# Patient Record
Sex: Male | Born: 2000
Health system: Southern US, Community
[De-identification: ages and names within clinical notes are randomized; demographics above are authoritative.]

## PROBLEM LIST (undated history)

## (undated) DIAGNOSIS — R109 Unspecified abdominal pain: Secondary | ICD-10-CM

## (undated) HISTORY — DX: Unspecified abdominal pain: R10.9

---
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
## 2020-09-15 DIAGNOSIS — J0110 Acute frontal sinusitis, unspecified: Secondary | ICD-10-CM | POA: Diagnosis not present

## 2020-09-15 DIAGNOSIS — R070 Pain in throat: Secondary | ICD-10-CM | POA: Diagnosis not present

## 2020-09-15 DIAGNOSIS — Z20822 Contact with and (suspected) exposure to covid-19: Secondary | ICD-10-CM | POA: Diagnosis not present

## 2020-09-22 DIAGNOSIS — J209 Acute bronchitis, unspecified: Secondary | ICD-10-CM | POA: Diagnosis not present

## 2020-09-22 DIAGNOSIS — J019 Acute sinusitis, unspecified: Secondary | ICD-10-CM | POA: Diagnosis not present

## 2020-09-22 DIAGNOSIS — Z03818 Encounter for observation for suspected exposure to other biological agents ruled out: Secondary | ICD-10-CM | POA: Diagnosis not present

## 2020-09-22 DIAGNOSIS — J029 Acute pharyngitis, unspecified: Secondary | ICD-10-CM | POA: Diagnosis not present

## 2020-10-21 DIAGNOSIS — Z1152 Encounter for screening for COVID-19: Secondary | ICD-10-CM | POA: Diagnosis not present

## 2020-10-21 DIAGNOSIS — Z03818 Encounter for observation for suspected exposure to other biological agents ruled out: Secondary | ICD-10-CM | POA: Diagnosis not present

## 2021-02-02 DIAGNOSIS — L239 Allergic contact dermatitis, unspecified cause: Secondary | ICD-10-CM | POA: Diagnosis not present

## 2021-02-08 DIAGNOSIS — Z03818 Encounter for observation for suspected exposure to other biological agents ruled out: Secondary | ICD-10-CM | POA: Diagnosis not present

## 2021-02-08 DIAGNOSIS — Z20822 Contact with and (suspected) exposure to covid-19: Secondary | ICD-10-CM | POA: Diagnosis not present

## 2021-03-16 DIAGNOSIS — R300 Dysuria: Secondary | ICD-10-CM | POA: Diagnosis not present

## 2021-04-25 DIAGNOSIS — Z03818 Encounter for observation for suspected exposure to other biological agents ruled out: Secondary | ICD-10-CM | POA: Diagnosis not present

## 2021-04-25 DIAGNOSIS — Z20822 Contact with and (suspected) exposure to covid-19: Secondary | ICD-10-CM | POA: Diagnosis not present

## 2021-05-04 DIAGNOSIS — J40 Bronchitis, not specified as acute or chronic: Secondary | ICD-10-CM | POA: Diagnosis not present

## 2021-05-04 DIAGNOSIS — R059 Cough, unspecified: Secondary | ICD-10-CM | POA: Diagnosis not present

## 2021-06-07 DIAGNOSIS — R062 Wheezing: Secondary | ICD-10-CM | POA: Diagnosis not present

## 2021-06-07 DIAGNOSIS — J4520 Mild intermittent asthma, uncomplicated: Secondary | ICD-10-CM | POA: Diagnosis not present

## 2021-06-07 DIAGNOSIS — J45909 Unspecified asthma, uncomplicated: Secondary | ICD-10-CM | POA: Diagnosis not present

## 2021-08-29 ENCOUNTER — Encounter: Payer: Self-pay | Admitting: *Deleted

## 2021-08-29 ENCOUNTER — Other Ambulatory Visit: Payer: Self-pay

## 2021-08-29 DIAGNOSIS — Z5321 Procedure and treatment not carried out due to patient leaving prior to being seen by health care provider: Secondary | ICD-10-CM | POA: Insufficient documentation

## 2021-08-29 DIAGNOSIS — R109 Unspecified abdominal pain: Secondary | ICD-10-CM | POA: Insufficient documentation

## 2021-08-29 DIAGNOSIS — Z20822 Contact with and (suspected) exposure to covid-19: Secondary | ICD-10-CM | POA: Diagnosis not present

## 2021-08-29 DIAGNOSIS — R110 Nausea: Secondary | ICD-10-CM | POA: Insufficient documentation

## 2021-08-29 DIAGNOSIS — R1084 Generalized abdominal pain: Secondary | ICD-10-CM | POA: Diagnosis present

## 2021-08-29 DIAGNOSIS — K353 Acute appendicitis with localized peritonitis, without perforation or gangrene: Secondary | ICD-10-CM | POA: Diagnosis not present

## 2021-08-29 NOTE — ED Triage Notes (Addendum)
Pt to ED reporting abd pain throughout the day. Pain is worse with moving and walking. Nausea without vomiting or diarrhea.    Pain is worse after eating.

## 2021-08-30 ENCOUNTER — Encounter
Admission: EM | Disposition: A | Discharge status: Home / Self Care | Payer: Self-pay | Source: Home / Self Care | Attending: Emergency Medicine

## 2021-08-30 ENCOUNTER — Other Ambulatory Visit: Payer: Self-pay

## 2021-08-30 ENCOUNTER — Observation Stay: Payer: 59 | Admitting: Registered Nurse

## 2021-08-30 ENCOUNTER — Observation Stay
Admission: EM | Admit: 2021-08-30 | Discharge: 2021-08-31 | Disposition: A | Discharge status: Home / Self Care | Payer: 59 | Attending: Surgery | Admitting: Surgery

## 2021-08-30 ENCOUNTER — Emergency Department
Admission: EM | Admit: 2021-08-30 | Discharge: 2021-08-30 | Disposition: A | Discharge status: Home / Self Care | Payer: 59 | Source: Home / Self Care

## 2021-08-30 ENCOUNTER — Emergency Department: Payer: 59

## 2021-08-30 DIAGNOSIS — K3580 Unspecified acute appendicitis: Secondary | ICD-10-CM | POA: Diagnosis present

## 2021-08-30 DIAGNOSIS — K353 Acute appendicitis with localized peritonitis, without perforation or gangrene: Secondary | ICD-10-CM | POA: Diagnosis not present

## 2021-08-30 DIAGNOSIS — Z20822 Contact with and (suspected) exposure to covid-19: Secondary | ICD-10-CM | POA: Insufficient documentation

## 2021-08-30 DIAGNOSIS — K3532 Acute appendicitis with perforation and localized peritonitis, without abscess: Secondary | ICD-10-CM

## 2021-08-30 DIAGNOSIS — R109 Unspecified abdominal pain: Secondary | ICD-10-CM | POA: Diagnosis not present

## 2021-08-30 DIAGNOSIS — K37 Unspecified appendicitis: Secondary | ICD-10-CM | POA: Diagnosis present

## 2021-08-30 HISTORY — PX: LAPAROSCOPIC APPENDECTOMY: SHX408

## 2021-08-30 LAB — COMPREHENSIVE METABOLIC PANEL
ALT: 21 U/L (ref 0–44)
AST: 22 U/L (ref 15–41)
Albumin: 4.4 g/dL (ref 3.5–5.0)
Alkaline Phosphatase: 73 U/L (ref 38–126)
Anion gap: 9 (ref 5–15)
BUN: 14 mg/dL (ref 6–20)
CO2: 27 mmol/L (ref 22–32)
Calcium: 9.4 mg/dL (ref 8.9–10.3)
Chloride: 102 mmol/L (ref 98–111)
Creatinine, Ser: 1.44 mg/dL — ABNORMAL HIGH (ref 0.61–1.24)
GFR, Estimated: >60 mL/min (ref >60)
Glucose, Bld: 108 mg/dL — ABNORMAL HIGH (ref 70–99)
Potassium: 3.9 mmol/L (ref 3.5–5.1)
Sodium: 138 mmol/L (ref 135–145)
Total Bilirubin: 0.8 mg/dL (ref 0.3–1.2)
Total Protein: 8.4 g/dL — ABNORMAL HIGH (ref 6.5–8.1)

## 2021-08-30 LAB — CBC
HCT: 45 % (ref 39.0–52.0)
Hemoglobin: 16.1 g/dL (ref 13.0–17.0)
MCH: 29.2 pg (ref 26.0–34.0)
MCHC: 35.8 g/dL (ref 30.0–36.0)
MCV: 81.7 fL (ref 80.0–100.0)
Platelets: 313 10*3/uL (ref 150–400)
RBC: 5.51 MIL/uL (ref 4.22–5.81)
RDW: 11.6 % (ref 11.5–15.5)
WBC: 15.8 10*3/uL — ABNORMAL HIGH (ref 4.0–10.5)
nRBC: 0 % (ref 0.0–0.2)

## 2021-08-30 LAB — URINALYSIS, ROUTINE W REFLEX MICROSCOPIC
Bacteria, UA: NONE SEEN
Bilirubin Urine: NEGATIVE
Glucose, UA: NEGATIVE mg/dL
Hgb urine dipstick: NEGATIVE
Ketones, ur: NEGATIVE mg/dL
Leukocytes,Ua: NEGATIVE
Nitrite: NEGATIVE
Protein, ur: NEGATIVE mg/dL
Specific Gravity, Urine: 1.018 (ref 1.005–1.030)
Squamous Epithelial / HPF: NONE SEEN (ref 0–5)
pH: 5 (ref 5.0–8.0)

## 2021-08-30 LAB — RESP PANEL BY RT-PCR (FLU A&B, COVID) ARPGX2
Influenza A by PCR: NEGATIVE
Influenza B by PCR: NEGATIVE
SARS Coronavirus 2 by RT PCR: NEGATIVE

## 2021-08-30 LAB — LIPASE, BLOOD: Lipase: 31 U/L (ref 11–51)

## 2021-08-30 SURGERY — APPENDECTOMY, LAPAROSCOPIC
Anesthesia: General | Site: Abdomen

## 2021-08-30 MED ORDER — PIPERACILLIN-TAZOBACTAM 3.375 G IVPB
3.3750 g | Freq: Three times a day (TID) | INTRAVENOUS | Status: DC
  Administered 2021-08-30 – 2021-08-31 (×3): 3.375 g via INTRAVENOUS
  Filled 2021-08-30 (×3): qty 50

## 2021-08-30 MED ORDER — ACETAMINOPHEN 500 MG PO TABS: 1000.0000 mg | ORAL_TABLET | Freq: Four times a day (QID) | ORAL | Status: DC

## 2021-08-30 MED ORDER — ROCURONIUM BROMIDE 100 MG/10ML IV SOLN
INTRAVENOUS | Status: DC | PRN
  Administered 2021-08-30: 10 mg via INTRAVENOUS
  Administered 2021-08-30: 40 mg via INTRAVENOUS

## 2021-08-30 MED ORDER — KETOROLAC TROMETHAMINE 15 MG/ML IJ SOLN
15.0000 mg | Freq: Four times a day (QID) | INTRAMUSCULAR | Status: DC
  Administered 2021-08-30 – 2021-08-31 (×3): 15 mg via INTRAVENOUS
  Filled 2021-08-30 (×3): qty 1

## 2021-08-30 MED ORDER — 0.9 % SODIUM CHLORIDE (POUR BTL) OPTIME
TOPICAL | Status: DC | PRN
  Administered 2021-08-30: 200 mL

## 2021-08-30 MED ORDER — SODIUM CHLORIDE 0.9 % IV SOLN: INTRAVENOUS | Status: DC

## 2021-08-30 MED ORDER — MIDAZOLAM HCL 2 MG/2ML IJ SOLN
INTRAMUSCULAR | Status: DC | PRN
  Administered 2021-08-30: 2 mg via INTRAVENOUS

## 2021-08-30 MED ORDER — ONDANSETRON HCL 4 MG/2ML IJ SOLN
INTRAMUSCULAR | Status: DC | PRN
  Administered 2021-08-30: 4 mg via INTRAVENOUS

## 2021-08-30 MED ORDER — DEXMEDETOMIDINE (PRECEDEX) IN NS 20 MCG/5ML (4 MCG/ML) IV SYRINGE
PREFILLED_SYRINGE | INTRAVENOUS | Status: DC | PRN
  Administered 2021-08-30 (×2): 4 ug via INTRAVENOUS

## 2021-08-30 MED ORDER — KETOROLAC TROMETHAMINE 30 MG/ML IJ SOLN
INTRAMUSCULAR | Status: DC | PRN
  Administered 2021-08-30: 15 mg via INTRAVENOUS

## 2021-08-30 MED ORDER — FENTANYL CITRATE (PF) 100 MCG/2ML IJ SOLN
INTRAMUSCULAR | Status: AC
  Filled 2021-08-30: qty 2

## 2021-08-30 MED ORDER — OXYCODONE HCL 5 MG PO TABS
5.0000 mg | ORAL_TABLET | ORAL | Status: DC | PRN
  Administered 2021-08-30: 5 mg via ORAL

## 2021-08-30 MED ORDER — ONDANSETRON HCL 4 MG/2ML IJ SOLN
4.0000 mg | Freq: Once | INTRAMUSCULAR | Status: AC
  Administered 2021-08-30: 4 mg via INTRAVENOUS
  Filled 2021-08-30: qty 2

## 2021-08-30 MED ORDER — DEXAMETHASONE SODIUM PHOSPHATE 10 MG/ML IJ SOLN
INTRAMUSCULAR | Status: DC | PRN
  Administered 2021-08-30: 10 mg via INTRAVENOUS

## 2021-08-30 MED ORDER — MORPHINE SULFATE (PF) 4 MG/ML IV SOLN
6.0000 mg | Freq: Once | INTRAVENOUS | Status: AC
  Administered 2021-08-30: 6 mg via INTRAVENOUS
  Filled 2021-08-30: qty 2

## 2021-08-30 MED ORDER — ONDANSETRON HCL 4 MG/2ML IJ SOLN: 4.0000 mg | Freq: Four times a day (QID) | INTRAMUSCULAR | Status: DC | PRN

## 2021-08-30 MED ORDER — BUPIVACAINE LIPOSOME 1.3 % IJ SUSP
INTRAMUSCULAR | Status: AC
  Filled 2021-08-30: qty 20

## 2021-08-30 MED ORDER — ACETAMINOPHEN 500 MG PO TABS
1000.0000 mg | ORAL_TABLET | Freq: Four times a day (QID) | ORAL | Status: DC
  Administered 2021-08-30 – 2021-08-31 (×4): 1000 mg via ORAL
  Filled 2021-08-30 (×4): qty 2

## 2021-08-30 MED ORDER — ONDANSETRON HCL 4 MG/2ML IJ SOLN
INTRAMUSCULAR | Status: AC
  Filled 2021-08-30: qty 2

## 2021-08-30 MED ORDER — ONDANSETRON 4 MG PO TBDP: 4.0000 mg | ORAL_TABLET | Freq: Four times a day (QID) | ORAL | Status: DC | PRN

## 2021-08-30 MED ORDER — ROCURONIUM BROMIDE 10 MG/ML (PF) SYRINGE
PREFILLED_SYRINGE | INTRAVENOUS | Status: AC
  Filled 2021-08-30: qty 10

## 2021-08-30 MED ORDER — PHENYLEPHRINE HCL-NACL 20-0.9 MG/250ML-% IV SOLN
INTRAVENOUS | Status: AC
  Filled 2021-08-30: qty 250

## 2021-08-30 MED ORDER — LACTATED RINGERS IV BOLUS
1000.0000 mL | Freq: Once | INTRAVENOUS | Status: AC
  Administered 2021-08-30: 1000 mL via INTRAVENOUS

## 2021-08-30 MED ORDER — LIDOCAINE HCL (CARDIAC) PF 100 MG/5ML IV SOSY
PREFILLED_SYRINGE | INTRAVENOUS | Status: DC | PRN
  Administered 2021-08-30: 80 mg via INTRAVENOUS

## 2021-08-30 MED ORDER — FENTANYL CITRATE (PF) 100 MCG/2ML IJ SOLN
INTRAMUSCULAR | Status: DC | PRN
  Administered 2021-08-30: 100 ug via INTRAVENOUS

## 2021-08-30 MED ORDER — SUGAMMADEX SODIUM 200 MG/2ML IV SOLN
INTRAVENOUS | Status: DC | PRN
  Administered 2021-08-30: 200 mg via INTRAVENOUS

## 2021-08-30 MED ORDER — PROPOFOL 10 MG/ML IV BOLUS
INTRAVENOUS | Status: AC
  Filled 2021-08-30: qty 20

## 2021-08-30 MED ORDER — PIPERACILLIN-TAZOBACTAM 3.375 G IVPB 30 MIN: 3.3750 g | Freq: Once | INTRAVENOUS | Status: DC

## 2021-08-30 MED ORDER — IOHEXOL 300 MG/ML  SOLN
100.0000 mL | Freq: Once | INTRAMUSCULAR | Status: AC | PRN
  Administered 2021-08-30: 100 mL via INTRAVENOUS
  Filled 2021-08-30: qty 100

## 2021-08-30 MED ORDER — DEXAMETHASONE SODIUM PHOSPHATE 10 MG/ML IJ SOLN
INTRAMUSCULAR | Status: AC
  Filled 2021-08-30: qty 1

## 2021-08-30 MED ORDER — BUPIVACAINE-EPINEPHRINE 0.25% -1:200000 IJ SOLN
INTRAMUSCULAR | Status: DC | PRN
  Administered 2021-08-30: 50 mL via SURGICAL_CAVITY

## 2021-08-30 MED ORDER — BUPIVACAINE-EPINEPHRINE (PF) 0.25% -1:200000 IJ SOLN
INTRAMUSCULAR | Status: AC
  Filled 2021-08-30: qty 30

## 2021-08-30 MED ORDER — FENTANYL CITRATE (PF) 100 MCG/2ML IJ SOLN: 25.0000 ug | INTRAMUSCULAR | Status: DC | PRN

## 2021-08-30 MED ORDER — SUCCINYLCHOLINE CHLORIDE 200 MG/10ML IV SOSY
PREFILLED_SYRINGE | INTRAVENOUS | Status: DC | PRN
  Administered 2021-08-30: 120 mg via INTRAVENOUS

## 2021-08-30 MED ORDER — OXYCODONE HCL 5 MG PO TABS: 5.0000 mg | ORAL_TABLET | ORAL | Status: DC | PRN

## 2021-08-30 MED ORDER — PANTOPRAZOLE SODIUM 40 MG IV SOLR
40.0000 mg | Freq: Every day | INTRAVENOUS | Status: DC
  Administered 2021-08-30: 40 mg via INTRAVENOUS
  Filled 2021-08-30: qty 40

## 2021-08-30 MED ORDER — PROPOFOL 10 MG/ML IV BOLUS
INTRAVENOUS | Status: DC | PRN
  Administered 2021-08-30: 170 mg via INTRAVENOUS

## 2021-08-30 MED ORDER — PHENYLEPHRINE HCL (PRESSORS) 10 MG/ML IV SOLN
INTRAVENOUS | Status: DC | PRN
  Administered 2021-08-30: 80 ug via INTRAVENOUS

## 2021-08-30 MED ORDER — OXYCODONE HCL 5 MG PO TABS
ORAL_TABLET | ORAL | Status: AC
  Filled 2021-08-30: qty 1

## 2021-08-30 MED ORDER — MIDAZOLAM HCL 2 MG/2ML IJ SOLN
INTRAMUSCULAR | Status: AC
  Filled 2021-08-30: qty 2

## 2021-08-30 MED ORDER — MORPHINE SULFATE (PF) 2 MG/ML IV SOLN: 2.0000 mg | INTRAVENOUS | Status: DC | PRN

## 2021-08-30 MED ORDER — HEPARIN SODIUM (PORCINE) 5000 UNIT/ML IJ SOLN
5000.0000 [IU] | Freq: Three times a day (TID) | INTRAMUSCULAR | Status: DC
  Administered 2021-08-31: 5000 [IU] via SUBCUTANEOUS
  Filled 2021-08-30: qty 1

## 2021-08-30 MED ORDER — ONDANSETRON HCL 4 MG/2ML IJ SOLN: 4.0000 mg | Freq: Once | INTRAMUSCULAR | Status: DC | PRN

## 2021-08-30 SURGICAL SUPPLY — 38 items
APPLIER CLIP 5 13 M/L LIGAMAX5 (MISCELLANEOUS) ×2
BLADE CLIPPER SURG (BLADE) ×2 IMPLANT
CHLORAPREP W/TINT 26 (MISCELLANEOUS) ×2 IMPLANT
CLIP APPLIE 5 13 M/L LIGAMAX5 (MISCELLANEOUS) ×1 IMPLANT
CUTTER FLEX LINEAR 45M (STAPLE) ×2 IMPLANT
DERMABOND ADVANCED (GAUZE/BANDAGES/DRESSINGS) ×1
DERMABOND ADVANCED .7 DNX12 (GAUZE/BANDAGES/DRESSINGS) ×1 IMPLANT
ELECT CAUTERY BLADE 6.4 (BLADE) ×2 IMPLANT
ELECT REM PT RETURN 9FT ADLT (ELECTROSURGICAL) ×2
ELECTRODE REM PT RTRN 9FT ADLT (ELECTROSURGICAL) ×1 IMPLANT
GAUZE 4X4 16PLY ~~LOC~~+RFID DBL (SPONGE) ×2 IMPLANT
GLOVE SURG ENC MOIS LTX SZ7 (GLOVE) ×2 IMPLANT
GOWN STRL REUS W/ TWL LRG LVL3 (GOWN DISPOSABLE) ×2 IMPLANT
GOWN STRL REUS W/TWL LRG LVL3 (GOWN DISPOSABLE) ×2
IRRIGATION STRYKERFLOW (MISCELLANEOUS) ×1 IMPLANT
IRRIGATOR STRYKERFLOW (MISCELLANEOUS) ×2
IV NS 1000ML (IV SOLUTION) ×1
IV NS 1000ML BAXH (IV SOLUTION) ×1 IMPLANT
MANIFOLD NEPTUNE II (INSTRUMENTS) ×2 IMPLANT
NEEDLE HYPO 22GX1.5 SAFETY (NEEDLE) ×2 IMPLANT
NS IRRIG 500ML POUR BTL (IV SOLUTION) ×2 IMPLANT
PACK LAP CHOLECYSTECTOMY (MISCELLANEOUS) ×2 IMPLANT
PENCIL ELECTRO HAND CTR (MISCELLANEOUS) ×2 IMPLANT
POUCH SPECIMEN RETRIEVAL 10MM (ENDOMECHANICALS) ×2 IMPLANT
RELOAD 45 VASCULAR/THIN (ENDOMECHANICALS) ×2 IMPLANT
RELOAD STAPLE TA45 3.5 REG BLU (ENDOMECHANICALS) ×2 IMPLANT
SCISSORS METZENBAUM CVD 33 (INSTRUMENTS) IMPLANT
SHEARS HARMONIC ACE PLUS 36CM (ENDOMECHANICALS) ×2 IMPLANT
SLEEVE ENDOPATH XCEL 5M (ENDOMECHANICALS) ×2 IMPLANT
SPONGE T-LAP 18X18 ~~LOC~~+RFID (SPONGE) ×2 IMPLANT
SUT MNCRL AB 4-0 PS2 18 (SUTURE) ×2 IMPLANT
SUT VICRYL 0 AB UR-6 (SUTURE) ×4 IMPLANT
SYR 20ML LL LF (SYRINGE) ×2 IMPLANT
TRAY FOLEY MTR SLVR 16FR STAT (SET/KITS/TRAYS/PACK) ×2 IMPLANT
TROCAR XCEL BLUNT TIP 100MML (ENDOMECHANICALS) ×2 IMPLANT
TROCAR XCEL NON-BLD 5MMX100MML (ENDOMECHANICALS) ×4 IMPLANT
TUBING EVAC SMOKE HEATED PNEUM (TUBING) ×2 IMPLANT
WATER STERILE IRR 500ML POUR (IV SOLUTION) ×2 IMPLANT

## 2021-08-30 NOTE — ED Triage Notes (Signed)
Pt to ED from [facility] for generalized abd pain that started yesterday am. LWBS last night, labs visible.  +nausea Pt in NAD, ambulatory

## 2021-08-30 NOTE — Anesthesia Preprocedure Evaluation (Signed)
Anesthesia Evaluation  Patient identified by MRN, date of birth, ID band Patient awake    Reviewed: Allergy & Precautions, H&P , NPO status , Patient's Chart, lab work & pertinent test results, reviewed documented beta blocker date and time   Airway Mallampati: II  TM Distance: >3 FB Neck ROM: full    Dental  (+) Teeth Intact   Pulmonary neg pulmonary ROS, Current Smoker,    Pulmonary exam normal        Cardiovascular Exercise Tolerance: Good negative cardio ROS Normal cardiovascular exam Rhythm:regular Rate:Normal     Neuro/Psych negative neurological ROS  negative psych ROS   GI/Hepatic negative GI ROS, Neg liver ROS,   Endo/Other  negative endocrine ROS  Renal/GU negative Renal ROS  negative genitourinary   Musculoskeletal   Abdominal   Peds  Hematology negative hematology ROS (+)   Anesthesia Other Findings Past Medical History: No date: Abdominal pain, recurrent History reviewed. No pertinent surgical history. BMI    Body Mass Index: 22.65 kg/m     Reproductive/Obstetrics negative OB ROS                             Anesthesia Physical Anesthesia Plan  ASA: 2 and emergent  Anesthesia Plan: General ETT   Post-op Pain Management:    Induction:   PONV Risk Score and Plan: 2  Airway Management Planned:   Additional Equipment:   Intra-op Plan:   Post-operative Plan:   Informed Consent: I have reviewed the patients History and Physical, chart, labs and discussed the procedure including the risks, benefits and alternatives for the proposed anesthesia with the patient or authorized representative who has indicated his/her understanding and acceptance.     Dental Advisory Given  Plan Discussed with: CRNA  Anesthesia Plan Comments:         Anesthesia Quick Evaluation

## 2021-08-30 NOTE — Op Note (Signed)
laparascopic appendectomy   Jaydrian Corpening Amend Date of operation:  08/30/2021  Indications: The patient presented with a history of  abdominal pain. Workup has revealed findings consistent with acute appendicitis.  Pre-operative Diagnosis: Acute appendicitis without mention of peritonitis  Post-operative Diagnosis: Same  Surgeon: Sterling Big, MD, FACS  Anesthesia: General with endotracheal tube  Findings: Acute appendicitis, severely dilated but no free rupture Some cloudy peritoneal fluid  Estimated Blood Loss: 5cc         Specimens: appendix         Complications:  none  Procedure Details  The patient was seen again in the preop area. The options of surgery versus observation were reviewed with the patient and/or family. The risks of bleeding, infection, recurrence of symptoms, negative laparoscopy, potential for an open procedure, bowel injury, abscess or infection, were all reviewed as well. The patient was taken to Operating Room, identified as Cory Mckee and the procedure verified as laparoscopic appendectomy. A Time Out was held and the above information confirmed.  The patient was placed in the supine position and general anesthesia was induced.  Antibiotic prophylaxis was administered and VT E prophylaxis was in place.  The abdomen was prepped and draped in a sterile fashion. An infraumbilical incision was made. A cutdown technique was used to enter the abdominal cavity. Two vicryl stitches were placed on the fascia and a Hasson trocar inserted. Pneumoperitoneum obtained. Two 5 mm ports were placed under direct visualization.   The appendix was identified and found to be acutely and severely inflamed but no free perforation.  The appendix was carefully dissected. The mesoappendix was divided withHarmonic scalpel. The base of the appendix was dissected out and divided with a standard load Endo GIA.The appendix was placed in a Endo Catch bag and removed via the Hasson  port. The right lower quadrant and pelvis was then irrigated with  normal saline which was aspirated. Inspection  failed to identify any additional bleeding and there were no signs of bowel injury. Again the right lower quadrant was inspected there was no sign of bleeding or bowel injury therefore pneumoperitoneum was released, all ports were removed.  The umbilical fascia was closed with 0 Vicryl interrupted sutures and the skin incisions were approximated with subcuticular 4-0 Monocryl. Dermabond was placed The patient tolerated the procedure well, there were no complications. The sponge lap and needle count were correct at the end of the procedure. Liposomal marcaine injected at all incision sites.  The patient was taken to the recovery room in stable condition to be admitted for continued care.    Sterling Big, MD FACS

## 2021-08-30 NOTE — H&P (Signed)
[facility] SURGICAL HISTORY & PHYSICAL (cpt [phone])  HISTORY OF PRESENT ILLNESS (HPI):  20 y.o. male presented to [facility] ED today for abdominal pain. Patient reports when he went to bed two nights prior he felt "like something was off." When he woke up yesterday morning, he noticed generalized abdominal discomfort which he thought was indigestion. However, the pain continued to worsen throughout the day and into the night. This ultimately localized to his RLQ. He reports associated chills, nausea, and decreased appetite. No fever, cough, SOB, CP, or urinary changes. No history of similar. No previous intra-abdominal surgeries. Work up through [facility] showed a leukocytosis to 15.8K and AKI with sCr - 1.44. He underwent CT Abdomen/Pelvis in the ED today which was concerning for acute appendicitis. He was stared on Zosyn.   General surgery is consulted by emergency medicine physician Dr Hulan Saas, MD for evaluation and management of acute appendicitis.   PAST MEDICAL HISTORY (PMH):  Past Medical History:  Diagnosis Date   Abdominal pain, recurrent     Reviewed. Otherwise negative.   PAST SURGICAL HISTORY ([place]):  History reviewed. No pertinent surgical history.  Reviewed. Otherwise negative.   MEDICATIONS:  Prior to Admission medications   Medication Sig Start Date End Date Taking? Authorizing Provider  cetirizine (ZYRTEC) 10 MG tablet Take 10 mg by mouth daily.    [provider]  fluticasone (FLOVENT HFA) 110 MCG/ACT inhaler Inhale 1 puff into the lungs 2 (two) times daily.    [provider]  montelukast (SINGULAIR) 5 MG chewable tablet Chew 5 mg by mouth at bedtime.    [provider]  lansoprazole (PREVACID) 15 MG capsule Take 30 mg by mouth daily.   01/02/12  [provider]     ALLERGIES:  Allergies  Allergen Reactions   Food     Mango , Pistachio, cashews     SOCIAL HISTORY:  Social History   Socioeconomic  History   Marital status: Single    Spouse name: Not on file   Number of children: Not on file   Years of education: Not on file   Highest education level: Not on file  Occupational History   Not on file  Tobacco Use   Smoking status: Never   Smokeless tobacco: Never  Substance and Sexual Activity   Alcohol use: Not on file   Drug use: Not on file   Sexual activity: Not on file  Other Topics Concern   Not on file  Social History Narrative   Not on file   Social Determinants of Health   Financial Resource Strain: Not on file  Food Insecurity: Not on file  Transportation Needs: Not on file  Physical Activity: Not on file  Stress: Not on file  Social Connections: Not on file  Intimate Partner Violence: Not on file     FAMILY HISTORY:  No family history on file.  Otherwise negative.   REVIEW OF SYSTEMS:  Review of Systems  Constitutional:  Positive for chills. Negative for fever.        + Decreased appetite   HENT:  Negative for congestion and sore throat.   Respiratory:  Negative for cough and shortness of breath.   Cardiovascular:  Negative for chest pain and palpitations.  Gastrointestinal:  Positive for abdominal pain and nausea. Negative for constipation, diarrhea and vomiting.  Genitourinary:  Negative for dysuria and urgency.  All other systems reviewed and are negative.  VITAL SIGNS:  Temp:  [98.2 F (36.8  C)-99.1 F (37.3 C)] 98.2 F (36.8 C) (11/15 0909) Pulse Rate:  [62-87] 62 (11/15 0909) Resp:  [18] 18 (11/15 0909) BP: (134-143)/(82-92) 143/92 (11/15 0909) SpO2:  [94 %-96 %] 94 % (11/15 0909) Weight:  [44.9 kg-75.8 kg] 75.8 kg (11/15 0909)     Height: 6' (182.9 cm) Weight: 75.8 kg BMI (Calculated): 22.64   PHYSICAL EXAM:  Physical Exam Vitals and nursing note reviewed.  Constitutional:      General: He is not in acute distress.    Appearance: He is well-developed and normal weight. He is not ill-appearing.     Comments: Patient resting in bed,  girlfriend at bedside   HENT:     Head: Normocephalic and atraumatic.  Cardiovascular:     Rate and Rhythm: Normal rate and regular rhythm.     Heart sounds: Normal heart sounds.  Pulmonary:     Effort: Pulmonary effort is normal. No respiratory distress.     Breath sounds: Normal breath sounds.  Abdominal:     General: Abdomen is flat. There is no distension.     Palpations: Abdomen is soft.     Tenderness: There is abdominal tenderness in the right lower quadrant. There is no guarding or rebound. Positive signs include Rovsing's sign and McBurney's sign.     Comments: Soft, RLQ tenderness with positive McBurney's and Rovsing, non-distended, no rebound/guarding   Genitourinary:    Comments: Deferred Skin:    General: Skin is warm and dry.     Coloration: Skin is not jaundiced or pale.  Neurological:     General: No focal deficit present.     Mental Status: He is alert and oriented to person, place, and time.  Psychiatric:        Mood and Affect: Mood normal.        Behavior: Behavior normal.    INTAKE/OUTPUT:  This shift: No intake/output data recorded.  Last 2 shifts: @IOLAST2SHIFTS @  Labs:  CBC Latest Ref Rng & Units 08/29/2021  WBC 4.0 - 10.5 K/uL 15.8(H)  Hemoglobin 13.0 - 17.0 g/dL 08/31/2021  Hematocrit 16.1 - 52.0 % 45.0  Platelets 150 - 400 K/uL 313   CMP Latest Ref Rng & Units 08/29/2021  Glucose 70 - 99 mg/dL 08/31/2021)  BUN 6 - 20 mg/dL 14  Creatinine 045(W - 0.98 mg/dL 1.19)  Sodium 1.47(W - 295 mmol/L 138  Potassium 3.5 - 5.1 mmol/L 3.9  Chloride 98 - 111 mmol/L 102  CO2 22 - 32 mmol/L 27  Calcium 8.9 - 10.3 mg/dL 9.4  Total Protein 6.5 - 8.1 g/dL 621)  Total Bilirubin 0.3 - 1.2 mg/dL 0.8  Alkaline Phos 38 - 126 U/L 73  AST 15 - 41 U/L 22  ALT 0 - 44 U/L 21     Imaging studies:   CT Abdomen/Pelvis (08/30/2021) personally reviewed with dilated and inflamed appendix, positive appendicolith, there is some pelvic fluid, no gross abscess, no free air, and  radiologist report reviewed:  IMPRESSION: 1. Acute appendicitis. Inflammation surrounding the distended abnormal appendix with an appendicolith. Small amount of fluid in the pelvis. Findings are concerning for perforated appendicitis. No evidence for an abscess collection. 2. Chronic left hydronephrosis. This has been present on previous ultrasounds and could be related to a congenital UPJ obstruction or stricture.  Assessment/Plan: (ICD-10's: K35.80) 20 y.o. male with leukocytosis and RLQ abdominal pain found to have acute appendicitis.    - Admit to general surgery - Will plan on laparoscopic appendectomy with Dr 36 this  afternoon pending OR/Anesthesia availability - All risks, benefits, and alternatives to above procedure(s) were discussed with the patient, all of his questions were answered to his expressed satisfaction, patient expresses he wishes to proceed, and informed consent was obtained.    - NPO + IVF Resuscitation - IV Abx (Zosyn) - Monitor abdominal examination - Monitor leukocytosis; morning labs - Pain control prn; antiemetics prn   - Mobilization as tolerated   - DVT prophylaxis; hold for OR  All of the above findings and recommendations were discussed with the patient, and all of his questions were answered to his expressed satisfaction.  -- Edison Simon, PA-C [facility] 08/30/2021, 11:44 AM [phone] M-F: 7am - 4pm

## 2021-08-30 NOTE — Anesthesia Procedure Notes (Signed)
Procedure Name: Intubation Date/Time: 08/30/2021 1:58 PM Performed by: Omer Jack, CRNA Pre-anesthesia Checklist: Patient identified, Patient being monitored, Timeout performed, Emergency Drugs available and Suction available Patient Re-evaluated:Patient Re-evaluated prior to induction Oxygen Delivery Method: Circle system utilized Preoxygenation: Pre-oxygenation with 100% oxygen Induction Type: IV induction Ventilation: Mask ventilation without difficulty Laryngoscope Size: 3 and McGraph Grade View: Grade I Tube type: Oral Tube size: 7.5 mm Number of attempts: 1 Airway Equipment and Method: Stylet Placement Confirmation: ETT inserted through vocal cords under direct vision, positive ETCO2 and breath sounds checked- equal and bilateral Secured at: 21 cm Tube secured with: Tape Dental Injury: Teeth and Oropharynx as per pre-operative assessment

## 2021-08-30 NOTE — ED Provider Notes (Signed)
[facility] Emergency Department Provider Note  ____________________________________________   Event Date/Time   First MD Initiated Contact with Patient 08/30/21 1016     (approximate)  I have reviewed the triage vital signs and the nursing notes.   HISTORY  Chief Complaint Abdominal Pain   HPI Cory Mckee is a 20 y.o. male with a past medical history of asthma who presents for assessment of some generalized abdominal pain associate with nausea lacy started yesterday.  Patient states that today he had some loose stools but has not been having more bowel movements than normal.  He denies any significant pain with urination or blood in his urine or his stool.  He has not had any headache, earache, sore throat, cough, shortness of breath, chest pain, back pain, rash or recent falls or injuries.  He denies any significant NSAID use, EtOH use or illicit drug use.  He did come yesterday but left prior to being seen after labs were drawn.  No other acute concerns at this time.         Past Medical History:  Diagnosis Date   Abdominal pain, recurrent     Patient Active Problem List   Diagnosis Date Noted   Acute appendicitis 08/30/2021   Periumbilical abdominal pain     History reviewed. No pertinent surgical history.  Prior to Admission medications   Medication Sig Start Date End Date Taking? Authorizing Provider  cetirizine (ZYRTEC) 10 MG tablet Take 10 mg by mouth daily.    [provider]  fluticasone (FLOVENT HFA) 110 MCG/ACT inhaler Inhale 1 puff into the lungs 2 (two) times daily.    [provider]  montelukast (SINGULAIR) 5 MG chewable tablet Chew 5 mg by mouth at bedtime.    [provider]  lansoprazole (PREVACID) 15 MG capsule Take 30 mg by mouth daily.   01/02/12  [provider]    Allergies Food  No family history on file.  Social History Social History   Tobacco Use   Smoking status:  Never   Smokeless tobacco: Never    Review of Systems  Review of Systems  Constitutional:  Negative for chills and fever.  HENT:  Negative for sore throat.   Eyes:  Negative for pain.  Respiratory:  Negative for cough and stridor.   Cardiovascular:  Negative for chest pain.  Gastrointestinal:  Positive for abdominal pain and nausea. Negative for vomiting.  Genitourinary:  Negative for dysuria.  Musculoskeletal:  Negative for myalgias.  Skin:  Negative for rash.  Neurological:  Negative for seizures, loss of consciousness and headaches.  Psychiatric/Behavioral:  Negative for suicidal ideas.   All other systems reviewed and are negative.    ____________________________________________   PHYSICAL EXAM:  VITAL SIGNS: ED Triage Vitals [08/30/21 0909]  Enc Vitals Group     BP (!) 143/92     Pulse Rate 62     Resp 18     Temp 98.2 F (36.8 C)     Temp Source Oral     SpO2 94 %     Weight 167 lb (75.8 kg)     Height 6' (1.829 m)     Head Circumference      Peak Flow      Pain Score 6     Pain Loc      Pain Edu?      Excl. in GC?    Vitals:   08/30/21 0909  BP: (!) 143/92  Pulse: 62  Resp: 18  Temp: 98.2 F (36.8 C)  SpO2: 94%   Physical Exam Vitals and nursing note reviewed.  Constitutional:      Appearance: He is well-developed.  HENT:     Head: Normocephalic and atraumatic.     Right Ear: External ear normal.     Left Ear: External ear normal.     Nose: Nose normal.     Mouth/Throat:     Mouth: Mucous membranes are dry.  Eyes:     Conjunctiva/sclera: Conjunctivae normal.  Cardiovascular:     Rate and Rhythm: Normal rate and regular rhythm.     Heart sounds: No murmur heard. Pulmonary:     Effort: Pulmonary effort is normal. No respiratory distress.     Breath sounds: Normal breath sounds.  Abdominal:     Palpations: Abdomen is soft.     Tenderness: There is generalized abdominal tenderness and tenderness in the right lower quadrant and suprapubic  area. There is guarding. There is no right CVA tenderness or left CVA tenderness.  Musculoskeletal:     Cervical back: Neck supple.  Skin:    General: Skin is warm and dry.     Capillary Refill: Capillary refill takes more than 3 seconds.  Neurological:     Mental Status: He is alert and oriented to person, place, and time.  Psychiatric:        Mood and Affect: Mood normal.     ____________________________________________   LABS (all labs ordered are listed, but only abnormal results are displayed)  Labs Reviewed  CULTURE, BLOOD (ROUTINE X 2)  CULTURE, BLOOD (ROUTINE X 2)  RESP PANEL BY RT-PCR (FLU A&B, COVID) ARPGX2   ____________________________________________  EKG  ____________________________________________  RADIOLOGY  ED MD interpretation: CT abdomen pelvis shows evidence of an acute appendicitis with some inflammatory changes and a small amount of free fluid concerning for perforation.  There is no clear abscess or evidence of hydronephrosis, kidney stone, diverticulitis or other clear acute abdominal or pelvic process.  There is evidence of chronic appearing left-sided hydronephrosis.  Official radiology report(s): CT ABDOMEN PELVIS W CONTRAST  Result Date: 08/30/2021 CLINICAL DATA:  Abdominal pain, acute, nonlocalized. EXAM: CT ABDOMEN AND PELVIS WITH CONTRAST TECHNIQUE: Multidetector CT imaging of the abdomen and pelvis was performed using the standard protocol following bolus administration of intravenous contrast. CONTRAST:  OMNIPAQUE IOHEXOL 300 MG/ML  SOLN COMPARISON:  Abdominal ultrasound 07/13/2015 FINDINGS: Lower chest: Lung bases are clear. Hepatobiliary: Normal appearance of the liver, gallbladder and portal venous system. No biliary dilatation Pancreas: Unremarkable. No pancreatic ductal dilatation or surrounding inflammatory changes. Spleen: Normal in size without focal abnormality. Adrenals/Urinary Tract: Normal adrenal glands. Normal appearance of the  right kidney without hydronephrosis. There is at least mild left hydronephrosis which appears to be chronic based on the previous ultrasound findings. Normal caliber of the proximal left ureter. Normal appearance of the urinary bladder. Stomach/Bowel: Inflammatory changes in the right lower abdomen and pelvis. There is a distended structure with inflammatory changes that is most compatible with an abnormal appendix. Structure measures 1.7 cm on sequence 2, image 58. Evidence for an appendicolith in the tip of the appendix. Limited evaluation of terminal ileum. No evidence for small bowel dilatation. Normal appearance of the stomach. Vascular/Lymphatic: Vascular structures are unremarkable. Prominent mesenteric lymph nodes in the right lower abdomen which could be reactive. Reproductive: Prostate is unremarkable. Other: Small amount of free fluid in the pelvis. Musculoskeletal: No acute bone abnormality. IMPRESSION: 1. Acute appendicitis. Inflammation  surrounding the distended abnormal appendix with an appendicolith. Small amount of fluid in the pelvis. Findings are concerning for perforated appendicitis. No evidence for an abscess collection. 2. Chronic left hydronephrosis. This has been present on previous ultrasounds and could be related to a congenital UPJ obstruction or stricture. These results were called by telephone at the time of interpretation on 08/30/2021 at 11:32 am to provider [facility] , who verbally acknowledged these results. Electronically Signed   By: Richarda Overlie M.D.   On: 08/30/2021 11:35    ____________________________________________   PROCEDURES  Procedure(s) performed (including Critical Care):  Procedures   ____________________________________________   INITIAL IMPRESSION / ASSESSMENT AND PLAN / ED COURSE      Patient presents with above-stated history exam returning after initially leaving yesterday due to long weights for evaluation of some generalized abdominal pain  associate with nausea.  On arrival patient is slightly hypertensive with otherwise stable vital signs on room air.  He is fairly tender throughout his abdomen but slightly more in the right lower quadrant and suprapubic area.  He does have some voluntary guarding.  He does appear dehydrated.  Differential includes acute appendicitis, diverticulitis, gastroenteritis, pancreatitis, cholecystitis and kidney stone versus cystitis.  I was able to review labs obtained yesterday evening.  Lipase of 31 not consistent with acute pancreatitis.  CMP shows a creatinine of 1.4 without any other significant electrolyte or metabolic derangements.  Recent kidney functions studies to compare this to although in 2016 patient's kidney function was normal with a creatinine of 1.1 suspect this represents an AKI today.  CBC shows leukocytosis with WBC count of 15.8 with no evidence of acute anemia and normal platelets.  UA not suggestive of cystitis.  CT abdomen pelvis shows evidence of an acute appendicitis with some inflammatory changes and a small amount of free fluid concerning for perforation.  There is no clear abscess or evidence of hydronephrosis, kidney stone, diverticulitis or other clear acute abdominal or pelvic process.  There is evidence of chronic appearing left-sided hydronephrosis.  Surgery service consulted for above-noted findings concerning for acute appendicitis with perforation.  Blood cultures obtained and patient started on antibiotics.  Admitted to surgery service.      ____________________________________________   FINAL CLINICAL IMPRESSION(S) / ED DIAGNOSES  Final diagnoses:  Acute appendicitis with perforation and localized peritonitis, without abscess or gangrene    Medications  0.9 %  sodium chloride infusion (has no administration in time range)  piperacillin-tazobactam (ZOSYN) IVPB 3.375 g (has no administration in time range)  acetaminophen (TYLENOL) tablet 1,000 mg (has no  administration in time range)  oxyCODONE (Oxy IR/ROXICODONE) immediate release tablet 5-10 mg (has no administration in time range)  morphine 2 MG/ML injection 2-4 mg (has no administration in time range)  ondansetron (ZOFRAN-ODT) disintegrating tablet 4 mg (has no administration in time range)    Or  ondansetron (ZOFRAN) injection 4 mg (has no administration in time range)  lactated ringers bolus 1,000 mL (1,000 mLs Intravenous New Bag/Given 08/30/21 1110)  ondansetron (ZOFRAN) injection 4 mg (4 mg Intravenous Given 08/30/21 1110)  morphine 4 MG/ML injection 6 mg (6 mg Intravenous Given 08/30/21 1130)  iohexol (OMNIPAQUE) 300 MG/ML solution 100 mL (100 mLs Intravenous Contrast Given 08/30/21 1115)     ED Discharge Orders     None        Note:  This document was prepared using Dragon voice recognition software and may include unintentional dictation errors.    Gilles Chiquito, MD  08/30/21 1153  

## 2021-08-30 NOTE — Transfer of Care (Signed)
Immediate Anesthesia Transfer of Care Note  Patient: Cory Mckee  Procedure(s) Performed: APPENDECTOMY LAPAROSCOPIC  Patient Location: PACU  Anesthesia Type:General  Level of Consciousness: drowsy and patient cooperative  Airway & Oxygen Therapy: Patient Spontanous Breathing and Patient connected to nasal cannula oxygen  Post-op Assessment: Report given to RN and Post -op Vital signs reviewed and stable  Post vital signs: Reviewed and stable  Last Vitals:  Vitals Value Taken Time  BP 97/40 08/30/21 1455  Temp    Pulse 73 08/30/21 1456  Resp 20 08/30/21 1456  SpO2 100 % 08/30/21 1456  Vitals shown include unvalidated device data.  Last Pain:  Vitals:   08/30/21 1301  TempSrc: Oral  PainSc: 0-No pain         Complications: No notable events documented.

## 2021-08-31 ENCOUNTER — Encounter: Payer: Self-pay | Admitting: Surgery

## 2021-08-31 DIAGNOSIS — K353 Acute appendicitis with localized peritonitis, without perforation or gangrene: Secondary | ICD-10-CM | POA: Diagnosis not present

## 2021-08-31 DIAGNOSIS — Z20822 Contact with and (suspected) exposure to covid-19: Secondary | ICD-10-CM | POA: Diagnosis not present

## 2021-08-31 LAB — CBC
HCT: 36 % — ABNORMAL LOW (ref 39.0–52.0)
Hemoglobin: 13 g/dL (ref 13.0–17.0)
MCH: 29.6 pg (ref 26.0–34.0)
MCHC: 36.1 g/dL — ABNORMAL HIGH (ref 30.0–36.0)
MCV: 82 fL (ref 80.0–100.0)
Platelets: 256 10*3/uL (ref 150–400)
RBC: 4.39 MIL/uL (ref 4.22–5.81)
RDW: 11.3 % — ABNORMAL LOW (ref 11.5–15.5)
WBC: 16.3 10*3/uL — ABNORMAL HIGH (ref 4.0–10.5)
nRBC: 0 % (ref 0.0–0.2)

## 2021-08-31 LAB — BASIC METABOLIC PANEL
Anion gap: 6 (ref 5–15)
BUN: 15 mg/dL (ref 6–20)
CO2: 23 mmol/L (ref 22–32)
Calcium: 8.6 mg/dL — ABNORMAL LOW (ref 8.9–10.3)
Chloride: 107 mmol/L (ref 98–111)
Creatinine, Ser: 1.35 mg/dL — ABNORMAL HIGH (ref 0.61–1.24)
GFR, Estimated: >60 mL/min (ref >60)
Glucose, Bld: 146 mg/dL — ABNORMAL HIGH (ref 70–99)
Potassium: 3.8 mmol/L (ref 3.5–5.1)
Sodium: 136 mmol/L (ref 135–145)

## 2021-08-31 LAB — HIV ANTIBODY (ROUTINE TESTING W REFLEX): HIV Screen 4th Generation wRfx: NONREACTIVE

## 2021-08-31 MED ORDER — IBUPROFEN 600 MG PO TABS: 600.0000 mg | ORAL_TABLET | Freq: Four times a day (QID) | ORAL | 0 refills | Status: AC | PRN

## 2021-08-31 MED ORDER — AMOXICILLIN-POT CLAVULANATE 875-125 MG PO TABS: 1.0000 | ORAL_TABLET | Freq: Two times a day (BID) | ORAL | 0 refills | Status: AC

## 2021-08-31 MED ORDER — SODIUM CHLORIDE 0.9 % IV BOLUS
1000.0000 mL | Freq: Once | INTRAVENOUS | Status: AC
  Administered 2021-08-31: 1000 mL via INTRAVENOUS

## 2021-08-31 MED ORDER — OXYCODONE HCL 5 MG PO TABS
5.0000 mg | ORAL_TABLET | Freq: Four times a day (QID) | ORAL | 0 refills | Status: AC | PRN
Start: 2021-08-31 — End: ?

## 2021-08-31 NOTE — Discharge Summary (Signed)
[facility] SURGICAL DISCHARGE SUMMARY  Patient ID: Cory Mckee MRN: [id number] DOB/AGE: Nov 28, 2000 20 y.o.  Admit date: 08/30/2021 Discharge date: 08/31/2021  Discharge Diagnoses Patient Active Problem List   Diagnosis Date Noted   Acute appendicitis 08/30/2021    Consultants None  Procedures 08/30/2021:  Laparoscopic Appendectomy  HPI: 20 y.o. male presented to [facility] ED today for abdominal pain. Patient reports when he went to bed two nights prior he felt "like something was off." When he woke up yesterday morning, he noticed generalized abdominal discomfort which he thought was indigestion. However, the pain continued to worsen throughout the day and into the night. This ultimately localized to his RLQ. He reports associated chills, nausea, and decreased appetite. No fever, cough, SOB, CP, or urinary changes. No history of similar. No previous intra-abdominal surgeries. Work up through [facility] showed a leukocytosis to 15.8K and AKI with sCr - 1.44. He underwent CT Abdomen/Pelvis in the ED today which was concerning for acute appendicitis. He was stared on Zosyn.   Hospital Course: Informed consent was obtained and documented, and patient underwent uneventful laparoscopic appendectomy (Dr Everlene Farrier, 08/30/2021).  Post-operatively, patient's pain/symptoms improved/resolved and advancement of patient's diet and ambulation were well-tolerated. The remainder of patient's hospital course was essentially unremarkable, and discharge planning was initiated accordingly with patient safely able to be discharged home with appropriate discharge instructions, antibiotics(Augmentin x7 days), pain control, and outpatient follow-up after all of his questions were answered to his expressed satisfaction.  Discharge Condition: Good   Physical Examination:  Constitutional: Well appearing male, NAD Pulmonary: Normal effort, no respiratory distress Gastrointestinal: Soft,  incisional soreness, non-distended, no rebound/guarding Skin: Laparoscopic incisions are CDI with dermabond, no erythema or drainage    Allergies as of 08/31/2021       Reactions   Food    Mango , Pistachio, cashews        Medication List     TAKE these medications    amoxicillin-clavulanate 875-125 MG tablet Commonly known as: Augmentin Take 1 tablet by mouth 2 (two) times daily for 7 days.   cetirizine 10 MG tablet Commonly known as: ZYRTEC Take 10 mg by mouth daily.   fluticasone 110 MCG/ACT inhaler Commonly known as: FLOVENT HFA Inhale 1 puff into the lungs 2 (two) times daily.   ibuprofen 600 MG tablet Commonly known as: ADVIL Take 1 tablet (600 mg total) by mouth every 6 (six) hours as needed.   montelukast 5 MG chewable tablet Commonly known as: SINGULAIR Chew 5 mg by mouth at bedtime.   oxyCODONE 5 MG immediate release tablet Commonly known as: Oxy IR/ROXICODONE Take 1 tablet (5 mg total) by mouth every 6 (six) hours as needed for severe pain or breakthrough pain.          Follow-up Information     Donovan Kail, PA-C. Schedule an appointment as soon as possible for a visit in 3 week(s).   Specialty: Physician Assistant Why: laparosocpic appendectomy Contact information: [address] [place] [address] [phone]                  Time spent on discharge management including discussion of hospital course, clinical condition, outpatient instructions, prescriptions, and follow up with the patient and members of the medical team: >30 minutes  -- Lynden Oxford , PA-C [facility]  08/31/2021, 8:54 AM [phone] M-F: 7am - 4pm

## 2021-08-31 NOTE — Discharge Instructions (Signed)
In addition to included general post-operative instructions,  Diet: Resume home diet.   Activity: No heavy lifting >20 pounds (children, pets, laundry, garbage) or strenuous activity for 4 weeks, but light activity and walking are encouraged. Do not drive or drink alcohol if taking narcotic pain medications or having pain that might distract from driving.  Wound care: 2 days after surgery (11/17), you may shower/get incision wet with soapy water and pat dry (do not rub incisions), but no baths or submerging incision underwater until follow-up.   Medications: Resume all home medications. For mild to moderate pain: acetaminophen (Tylenol) or ibuprofen/naproxen (if no kidney disease). Combining Tylenol with alcohol can substantially increase your risk of causing liver disease. Narcotic pain medications, if prescribed, can be used for severe pain, though may cause nausea, constipation, and drowsiness. Do not combine Tylenol and Percocet (or similar) within a 6 hour period as Percocet (and similar) contain(s) Tylenol. If you do not need the narcotic pain medication, you do not need to fill the prescription.  Call office [phone] / [phone]) at any time if any questions, worsening pain, fevers/chills, bleeding, drainage from incision site, or other concerns.

## 2021-08-31 NOTE — Plan of Care (Signed)
  Problem: Education: Goal: Knowledge of General Education information will improve Description: Including pain rating scale, medication(s)/side effects and non-pharmacologic comfort measures Outcome: Adequate for Discharge   Problem: Education: Goal: Knowledge of General Education information will improve Description: Including pain rating scale, medication(s)/side effects and non-pharmacologic comfort measures Outcome: Adequate for Discharge   Problem: Health Behavior/Discharge Planning: Goal: Ability to manage health-related needs will improve Outcome: Adequate for Discharge   Problem: Clinical Measurements: Goal: Ability to maintain clinical measurements within normal limits will improve Outcome: Adequate for Discharge Goal: Will remain free from infection Outcome: Adequate for Discharge Goal: Diagnostic test results will improve Outcome: Adequate for Discharge Goal: Respiratory complications will improve Outcome: Adequate for Discharge Goal: Cardiovascular complication will be avoided Outcome: Adequate for Discharge   Problem: Activity: Goal: Risk for activity intolerance will decrease Outcome: Adequate for Discharge   Problem: Nutrition: Goal: Adequate nutrition will be maintained Outcome: Adequate for Discharge   Problem: Coping: Goal: Level of anxiety will decrease Outcome: Adequate for Discharge   Problem: Elimination: Goal: Will not experience complications related to bowel motility Outcome: Adequate for Discharge Goal: Will not experience complications related to urinary retention Outcome: Adequate for Discharge   Problem: Elimination: Goal: Will not experience complications related to bowel motility Outcome: Adequate for Discharge Goal: Will not experience complications related to urinary retention Outcome: Adequate for Discharge   Problem: Pain Managment: Goal: General experience of comfort will improve Outcome: Adequate for Discharge   Problem:  Safety: Goal: Ability to remain free from injury will improve Outcome: Adequate for Discharge

## 2021-08-31 NOTE — TOC CM/SW Note (Signed)
Patient has orders to discharge home today. Chart reviewed. PCP is Mickey Farber, MD. On room air. No wounds. No TOC needs identified. CSW signing off.  Charlynn Court, CSW [phone]

## 2021-09-01 LAB — SURGICAL PATHOLOGY

## 2021-09-02 DIAGNOSIS — K3532 Acute appendicitis with perforation and localized peritonitis, without abscess: Secondary | ICD-10-CM | POA: Diagnosis not present

## 2021-09-02 DIAGNOSIS — N179 Acute kidney failure, unspecified: Secondary | ICD-10-CM | POA: Diagnosis not present

## 2021-09-02 DIAGNOSIS — Z9049 Acquired absence of other specified parts of digestive tract: Secondary | ICD-10-CM | POA: Diagnosis not present

## 2021-09-02 DIAGNOSIS — D72829 Elevated white blood cell count, unspecified: Secondary | ICD-10-CM | POA: Diagnosis not present

## 2021-09-04 LAB — CULTURE, BLOOD (ROUTINE X 2)
Culture: NO GROWTH
Culture: NO GROWTH

## 2021-09-13 NOTE — Anesthesia Postprocedure Evaluation (Signed)
Anesthesia Post Note  Patient: Cory Mckee  Procedure(s) Performed: APPENDECTOMY LAPAROSCOPIC (Abdomen)  Patient location during evaluation: PACU Anesthesia Type: General Level of consciousness: awake and alert Pain management: pain level controlled Vital Signs Assessment: post-procedure vital signs reviewed and stable Respiratory status: spontaneous breathing, nonlabored ventilation, respiratory function stable and patient connected to nasal cannula oxygen Cardiovascular status: blood pressure returned to baseline and stable Postop Assessment: no apparent nausea or vomiting Anesthetic complications: no   No notable events documented.   Last Vitals:  Vitals:   08/31/21 0341 08/31/21 0732  BP: (!) 111/56 107/67  Pulse: (!) 54 88  Resp: 18 18  Temp: 36.5 C 36.9 C  SpO2: 99% 96%    Last Pain:  Vitals:   08/31/21 0956  TempSrc:   PainSc: 0-No pain                 Yevette Edwards

## 2021-09-14 ENCOUNTER — Telehealth: Payer: Self-pay | Admitting: Surgery

## 2021-09-14 NOTE — Telephone Encounter (Signed)
Patient is calling and is asking for a doctors note for his work and is asking to return on December 6th, also asked if this could be uploaded up his mychart. Please call and advise if needed.

## 2021-09-14 NOTE — Telephone Encounter (Signed)
Note has been written as the patient does not have [facility] MyChart we have put this at the front desk and he will call back once his MyChart is active.

## 2021-09-19 ENCOUNTER — Encounter: Payer: Self-pay | Admitting: Physician Assistant

## 2021-09-19 ENCOUNTER — Other Ambulatory Visit: Payer: Self-pay

## 2021-09-19 ENCOUNTER — Ambulatory Visit (INDEPENDENT_AMBULATORY_CARE_PROVIDER_SITE_OTHER): Payer: 59 | Admitting: Physician Assistant

## 2021-09-19 VITALS — BP 117/70 | HR 91 | Temp 98.1°F | Ht 72.0 in | Wt 165.2 lb

## 2021-09-19 DIAGNOSIS — K353 Acute appendicitis with localized peritonitis, without perforation or gangrene: Secondary | ICD-10-CM

## 2021-09-19 DIAGNOSIS — Z09 Encounter for follow-up examination after completed treatment for conditions other than malignant neoplasm: Secondary | ICD-10-CM

## 2021-09-19 NOTE — Progress Notes (Signed)
[facility] POST-OP OFFICE VISIT  09/19/2021  HPI: Cory Mckee is a 20 y.o. male 20 days s/p laparoscopic appendectomy for acute appendicitis with Dr Everlene Farrier.   He has done well at home. He did try to go back to work on Monday (11/28) but was too sore. Pain has since resolved No fever, chills, nausea, emesis, or bowel changes No issues with incisions  No other complaints  Vital signs: BP 117/70   Pulse 91   Temp 98.1 F (36.7 C) (Oral)   Ht 6' (1.829 m)   Wt 165 lb 3.2 oz (74.9 kg)   SpO2 99%   BMI 22.41 kg/m    Physical Exam: Constitutional: Well appearing male, NAD Abdomen: Soft, non-tender, non-distended, no rebound/guarding Skin: Laparoscopic incisions are well healed  Assessment/Plan: This is a 20 y.o. male 20 days s/p laparoscopic appendectomy for acute appendicitis    - Pain control prn  - Reviewed wound care  - Reviewed lifting restrictions; 4 weeks; work note given  - Reviewed surgical pathology: acute appendicitis   - He can follow up on as needed basis   -- Lynden Oxford, PA-C [facility] 09/19/2021, 10:28 AM [phone] M-F: 7am - 4pm

## 2021-09-19 NOTE — Patient Instructions (Addendum)
Please call our office if you have any questions or concerns.    If you have any concerns or questions, please feel free to call our office.   GENERAL POST-OPERATIVE PATIENT INSTRUCTIONS   WOUND CARE INSTRUCTIONS:  Keep a dry clean dressing on the wound if there is drainage. The initial bandage may be removed after 24 hours.  Once the wound has quit draining you may leave it open to air.  If clothing rubs against the wound or causes irritation and the wound is not draining you may cover it with a dry dressing during the daytime.  Try to keep the wound dry and avoid ointments on the wound unless directed to do so.  If the wound becomes bright red and painful or starts to drain infected material that is not clear, please contact your physician immediately.  If the wound is mildly pink and has a thick firm ridge underneath it, this is normal, and is referred to as a healing ridge.  This will resolve over the next 4-6 weeks.  BATHING: You may shower if you have been informed of this by your surgeon. However, Please do not submerge in a tub, hot tub, or pool until incisions are completely sealed or have been told by your surgeon that you may do so.  DIET:  You may eat any foods that you can tolerate.  It is a good idea to eat a high fiber diet and take in plenty of fluids to prevent constipation.  If you do become constipated you may want to take a mild laxative or take ducolax tablets on a daily basis until your bowel habits are regular.  Constipation can be very uncomfortable, along with straining, after recent surgery.  ACTIVITY:  You are encouraged to cough and deep breath or use your incentive spirometer if you were given one, every 15-30 minutes when awake.  This will help prevent respiratory complications and low grade fevers post-operatively if you had a general anesthetic.  You may want to hug a pillow when coughing and sneezing to add additional support to the surgical area, if you had abdominal  or chest surgery, which will decrease pain during these times.  You are encouraged to walk and engage in light activity for the next two weeks.  You should not lift, push, or pull more than 10-15 pounds, until 09/27/2021 as it could put you at increased risk for complications.  Twenty pounds is roughly equivalent to a plastic bag of groceries. At that time- Listen to your body when lifting, if you have pain when lifting, stop and then try again in a few days. Soreness after doing exercises or activities of daily living is normal as you get back in to your normal routine.  MEDICATIONS:  Try to take narcotic medications and anti-inflammatory medications, such as tylenol, ibuprofen, naprosyn, etc., with food.  This will minimize stomach upset from the medication.  Should you develop nausea and vomiting from the pain medication, or develop a rash, please discontinue the medication and contact your physician.  You should not drive, make important decisions, or operate machinery when taking narcotic pain medication.  SUNBLOCK Use sun block to incision area over the next year if this area will be exposed to sun. This helps decrease scarring and will allow you avoid a permanent darkened area over your incision.   Laparoscopic Appendectomy, Adult, Care After  This sheet gives you information about how to care for yourself after your procedure. Your doctor may also  give you more specific instructions. If you have problems or questions, contact your doctor. What can I expect after the procedure? After the procedure, it is common to have: Little energy for normal activities. Mild pain in the area where the cuts from surgery (incisions) were made. Trouble pooping (constipation). This can be caused by: Pain medicine. A lack of activity. Follow these instructions at home: Medicines Take over-the-counter and prescription medicines only as told by your doctor. If you were prescribed an antibiotic medicine, take  it as told by your doctor. Do not stop taking it even if you start to feel better. Do not drive or use heavy machinery while taking prescription pain medicine. Ask your doctor if the medicine you are taking can cause trouble pooping. You may need to take steps to prevent or treat trouble pooping: Drink enough fluid to keep your pee (urine) pale yellow. Take over-the-counter or prescription medicines. Eat foods that are high in fiber. These include beans, whole grains, and fresh fruits and vegetables. Limit foods that are high in fat and sugar. These include fried or sweet foods. Incision care  Follow instructions from your doctor about how to take care of your cuts from surgery. Make sure you: Wash your hands with soap and water before and after you change your bandage (dressing). If you cannot use soap and water, use hand sanitizer. Change your bandage as told by your doctor. Leave stitches (sutures), skin glue, or skin tape (adhesive) strips in place. They may need to stay in place for 2 weeks or longer. If tape strips get loose and curl up, you may trim the loose edges. Do not remove tape strips completely unless your doctor says it is okay. Check your cuts from surgery every day for signs of infection. Check for: Redness, swelling, or pain. Fluid or blood. Warmth. Pus or a bad smell. Bathing Keep your cuts from surgery clean and dry. Clean them as told by your doctor. To do this: Gently wash the cuts with soap and water. Rinse the cuts with water to remove all soap. Pat the cuts dry with a clean towel. Do not rub the cuts. Do not take baths, swim, or use a hot tub for 2 weeks, or until your doctor says it is okay. You may take showers after 48 hours. Activity  Do not drive for 24 hours if you were given a medicine to help you relax (sedative) during your procedure. Rest after the procedure. Return to your normal activities as told by your doctor. Ask your doctor what activities are  safe for you. For 3 weeks, or for as long as told by your doctor: Do not lift anything that is heavier than 10 lb (4.5 kg), or the limit that you are told. Do not play contact sports. General instructions If you were sent home with a drain, follow instructions from your doctor on how to care for it. Take deep breaths. This helps to keep your lungs from getting an infection (pneumonia). Keep all follow-up visits as told by your doctor. This is important. Contact a doctor if: You have redness, swelling, or pain around a cut from surgery. You have fluid or blood coming from a cut. Your cut feels warm to the touch. You have pus or a bad smell coming from a cut or a bandage. The edges of a cut break open after the stitches have been taken out. You have pain in your shoulders that gets worse. You feel dizzy or you pass  out (faint). You have shortness of breath. You keep feeling sick to your stomach (nauseous). You keep throwing up (vomiting). You get watery poop (diarrhea) or you cannot control your poop. You lose your appetite. You have swelling or pain in your legs. You get a rash. Get help right away if: You have a fever. You have trouble breathing. You have sharp pains in your chest. Summary After the procedure, it is common to have low energy, mild pain, and trouble pooping. Infection is a common problem after this procedure. Follow your doctor's instructions about caring for yourself after the procedure. Rest after the procedure. Return to your normal activities as told by your doctor. Contact your doctor if you see signs of infection around your cuts from surgery, or you get short of breath. Get help right away if you have a fever, chest pain, or trouble breathing. This information is not intended to replace advice given to you by your health care provider. Make sure you discuss any questions you have with your health care provider. Document Revised: 04/04/2018 Document Reviewed:  04/04/2018 Elsevier Patient Education  2022 [employer].

## 2021-10-31 ENCOUNTER — Other Ambulatory Visit: Payer: Self-pay

## 2021-10-31 MED ORDER — ALBUTEROL SULFATE HFA 108 (90 BASE) MCG/ACT IN AERS
2.0000 | INHALATION_SPRAY | RESPIRATORY_TRACT | 3 refills | Status: AC | PRN
  Filled 2021-10-31: qty 18, 25d supply, fill #0
  Filled 2022-02-22: qty 18, 25d supply, fill #1
  Filled 2022-05-30: qty 18, 25d supply, fill #2

## 2022-02-22 ENCOUNTER — Other Ambulatory Visit: Payer: Self-pay

## 2022-05-30 ENCOUNTER — Other Ambulatory Visit: Payer: Self-pay

## 2022-08-16 ENCOUNTER — Other Ambulatory Visit: Payer: Self-pay

## 2022-08-17 ENCOUNTER — Other Ambulatory Visit: Payer: Self-pay

## 2022-08-23 ENCOUNTER — Other Ambulatory Visit: Payer: Self-pay

## 2022-08-23 MED ORDER — ALBUTEROL SULFATE HFA 108 (90 BASE) MCG/ACT IN AERS
INHALATION_SPRAY | RESPIRATORY_TRACT | 4 refills | Status: AC
  Filled 2022-08-23: qty 6.7, 16d supply, fill #0
  Filled 2022-10-11: qty 6.7, 16d supply, fill #1
  Filled 2023-07-16: qty 6.7, 16d supply, fill #2

## 2022-11-14 ENCOUNTER — Other Ambulatory Visit: Payer: Self-pay

## 2022-11-14 DIAGNOSIS — Z131 Encounter for screening for diabetes mellitus: Secondary | ICD-10-CM | POA: Diagnosis not present

## 2022-11-14 DIAGNOSIS — L2084 Intrinsic (allergic) eczema: Secondary | ICD-10-CM | POA: Diagnosis not present

## 2022-11-14 DIAGNOSIS — F419 Anxiety disorder, unspecified: Secondary | ICD-10-CM | POA: Diagnosis not present

## 2022-11-14 DIAGNOSIS — Z13220 Encounter for screening for lipoid disorders: Secondary | ICD-10-CM | POA: Diagnosis not present

## 2022-11-14 DIAGNOSIS — Z1159 Encounter for screening for other viral diseases: Secondary | ICD-10-CM | POA: Diagnosis not present

## 2022-11-14 DIAGNOSIS — K5909 Other constipation: Secondary | ICD-10-CM | POA: Diagnosis not present

## 2022-11-14 DIAGNOSIS — Z0000 Encounter for general adult medical examination without abnormal findings: Secondary | ICD-10-CM | POA: Diagnosis not present

## 2022-11-14 DIAGNOSIS — Z23 Encounter for immunization: Secondary | ICD-10-CM | POA: Diagnosis not present

## 2022-11-14 DIAGNOSIS — Z91018 Allergy to other foods: Secondary | ICD-10-CM | POA: Diagnosis not present

## 2022-11-14 DIAGNOSIS — J4520 Mild intermittent asthma, uncomplicated: Secondary | ICD-10-CM | POA: Diagnosis not present

## 2022-11-14 DIAGNOSIS — K219 Gastro-esophageal reflux disease without esophagitis: Secondary | ICD-10-CM | POA: Diagnosis not present

## 2022-11-14 DIAGNOSIS — Z114 Encounter for screening for human immunodeficiency virus [HIV]: Secondary | ICD-10-CM | POA: Diagnosis not present

## 2022-11-14 DIAGNOSIS — J3081 Allergic rhinitis due to animal (cat) (dog) hair and dander: Secondary | ICD-10-CM | POA: Diagnosis not present

## 2022-11-14 MED ORDER — ALBUTEROL SULFATE HFA 108 (90 BASE) MCG/ACT IN AERS
2.0000 | INHALATION_SPRAY | RESPIRATORY_TRACT | 4 refills | Status: DC | PRN
  Filled 2022-11-14: qty 6.7, 16d supply, fill #0
  Filled 2023-02-14: qty 6.7, 16d supply, fill #1

## 2022-11-14 MED ORDER — PIMECROLIMUS 1 % EX CREA
1.0000 | TOPICAL_CREAM | Freq: Two times a day (BID) | CUTANEOUS | 11 refills | Status: AC
  Filled 2022-11-14: qty 30, 30d supply, fill #0
  Filled 2023-05-10: qty 30, 30d supply, fill #1
  Filled 2023-06-08: qty 30, 30d supply, fill #2
  Filled 2023-07-16: qty 60, 60d supply, fill #3

## 2022-11-14 MED ORDER — TRIAMCINOLONE ACETONIDE 0.1 % EX CREA
1.0000 | TOPICAL_CREAM | Freq: Two times a day (BID) | CUTANEOUS | 11 refills | Status: AC
  Filled 2022-11-14: qty 454, 30d supply, fill #0
  Filled 2023-03-29: qty 454, 30d supply, fill #1
  Filled 2023-06-08: qty 454, 30d supply, fill #2

## 2022-11-14 MED ORDER — MONTELUKAST SODIUM 10 MG PO TABS
10.0000 mg | ORAL_TABLET | Freq: Every day | ORAL | 11 refills | Status: DC
  Filled 2022-11-14: qty 30, 30d supply, fill #0
  Filled 2022-12-18: qty 30, 30d supply, fill #1
  Filled 2023-01-16: qty 30, 30d supply, fill #2
  Filled 2023-02-14: qty 30, 30d supply, fill #3
  Filled 2023-03-29: qty 30, 30d supply, fill #4
  Filled 2023-05-10: qty 30, 30d supply, fill #5
  Filled 2023-06-08: qty 30, 30d supply, fill #6
  Filled 2023-07-16: qty 30, 30d supply, fill #7
  Filled 2023-08-28: qty 30, 30d supply, fill #8

## 2022-11-14 MED ORDER — FLUTICASONE FUROATE-VILANTEROL 100-25 MCG/ACT IN AEPB
1.0000 | INHALATION_SPRAY | Freq: Every day | RESPIRATORY_TRACT | 0 refills | Status: AC
  Filled 2022-11-14: qty 60, 30d supply, fill #0

## 2022-11-14 MED ORDER — PNEUMOVAX 23 25 MCG/0.5ML IJ INJ: INJECTION | INTRAMUSCULAR | 0 refills | Status: AC

## 2022-11-15 ENCOUNTER — Other Ambulatory Visit: Payer: Self-pay

## 2022-12-08 ENCOUNTER — Other Ambulatory Visit: Payer: Self-pay

## 2022-12-08 MED ORDER — FLUTICASONE-SALMETEROL 100-50 MCG/ACT IN AEPB
1.0000 | INHALATION_SPRAY | Freq: Two times a day (BID) | RESPIRATORY_TRACT | 12 refills | Status: DC
  Filled 2022-12-08: qty 60, 30d supply, fill #0
  Filled 2023-01-16: qty 60, 30d supply, fill #1
  Filled 2023-02-14: qty 60, 30d supply, fill #2
  Filled 2023-03-29: qty 60, 30d supply, fill #3
  Filled 2023-05-10: qty 60, 30d supply, fill #4
  Filled 2023-06-08: qty 60, 30d supply, fill #5
  Filled 2023-07-16: qty 60, 30d supply, fill #6

## 2022-12-11 ENCOUNTER — Other Ambulatory Visit: Payer: Self-pay

## 2022-12-15 ENCOUNTER — Other Ambulatory Visit: Payer: Self-pay

## 2022-12-15 DIAGNOSIS — R946 Abnormal results of thyroid function studies: Secondary | ICD-10-CM | POA: Diagnosis not present

## 2022-12-15 MED ORDER — LEVOTHYROXINE SODIUM 25 MCG PO TABS
25.0000 ug | ORAL_TABLET | Freq: Every day | ORAL | 0 refills | Status: DC
  Filled 2022-12-15: qty 60, 60d supply, fill #0

## 2023-01-16 ENCOUNTER — Other Ambulatory Visit: Payer: Self-pay

## 2023-02-08 DIAGNOSIS — E039 Hypothyroidism, unspecified: Secondary | ICD-10-CM | POA: Diagnosis not present

## 2023-02-10 ENCOUNTER — Other Ambulatory Visit: Payer: Self-pay

## 2023-02-10 MED ORDER — LEVOTHYROXINE SODIUM 25 MCG PO TABS
25.0000 ug | ORAL_TABLET | Freq: Every day | ORAL | 3 refills | Status: AC
  Filled 2023-02-10: qty 90, 90d supply, fill #0
  Filled 2023-05-10: qty 90, 90d supply, fill #1
  Filled 2023-08-28: qty 90, 90d supply, fill #2
  Filled 2024-01-08: qty 90, 90d supply, fill #3

## 2023-02-11 ENCOUNTER — Other Ambulatory Visit: Payer: Self-pay

## 2023-02-14 ENCOUNTER — Other Ambulatory Visit: Payer: Self-pay

## 2023-03-29 ENCOUNTER — Other Ambulatory Visit: Payer: Self-pay

## 2023-03-30 ENCOUNTER — Other Ambulatory Visit: Payer: Self-pay

## 2023-04-02 ENCOUNTER — Other Ambulatory Visit: Payer: Self-pay

## 2023-04-03 ENCOUNTER — Other Ambulatory Visit: Payer: Self-pay

## 2023-05-10 ENCOUNTER — Other Ambulatory Visit: Payer: Self-pay

## 2023-05-12 ENCOUNTER — Telehealth: Payer: Commercial Managed Care - PPO | Admitting: Physician Assistant

## 2023-05-12 DIAGNOSIS — L2084 Intrinsic (allergic) eczema: Secondary | ICD-10-CM | POA: Diagnosis not present

## 2023-05-12 MED ORDER — PREDNISONE 20 MG PO TABS: 20.0000 mg | ORAL_TABLET | Freq: Every day | ORAL | 0 refills | Status: AC

## 2023-05-12 NOTE — Addendum Note (Signed)
Addended by: Margaretann Loveless on: 05/12/2023 08:03 PM   Modules accepted: Level of Service

## 2023-05-12 NOTE — Progress Notes (Signed)
E-Visit for Eczema  We are sorry that you are not feeling well. Here is how we plan to help! Based on what you shared with me it looks like you have eczema (atopic dermatitis).  Although the cause of eczema is not completely understood, genetics appear to play a strong role, and people with a family history of eczema are at increased risk of developing the condition. In most people with eczema, there is a genetic abnormality in the outermost layer of the skin, called the epidermis   Most people with eczema develop their first symptoms as children, before the age of 10. Intense itching of the skin, patches of redness, small bumps, and skin flaking are common. Scratching can further inflame the skin and worsen the itching. The itchiness may be more noticeable at nighttime.  Eczema commonly affects the back of the neck, the elbow creases, and the backs of the knees. Other affected areas may include the face, wrists, and forearms. The skin may become thickened and darkened, or even scarred, from repeated scratching. Eliminating factors that aggravate your eczema symptoms can help to control the symptoms. Possible triggers may include: ? Cold or dry environments ? Sweating ? Emotional stress or anxiety ? Rapid temperature changes ? Exposure to certain chemicals or cleaning solutions, including soaps and detergents, perfumes and cosmetics, wool or synthetic fibers, dust, sand, and cigarette smoke Keeping your skin hydrated Emollients -- Emollients are creams and ointments that moisturize the skin and prevent it from drying out. The best emollients for people with eczema are thick creams (such as Eucerin, Cetaphil, and Nutraderm) or ointments (such as petroleum jelly, Aquaphor, and Vaseline), which contain little to no water. Emollients are most effective when applied immediately after bathing. Emollients can be applied twice a day or more often if needed. Lotions contain more water than creams and  ointments and are less effective for moisturizing the skin. Bathing -- It is not clear if showers or baths are better for keeping the skin hydrated. Lukewarm baths or showers can hydrate and cool the skin, temporarily relieving itching from eczema. An unscented, mild soap or non-soap cleanser (such as Cetaphil) should be used sparingly. Apply an emollient immediately after bathing or showering to prevent your skin from drying out as a result of water evaporation. Emollient bath additives (products you add to the bath water) have not been found to help relieve symptoms. Hot or long baths (more than 10 to 15 minutes) and showers should be avoided since they can dry out the skin.  Based on what you shared with me you may have eczema.   Prednisone 20 mg tablets. Take one daily by mouth for 7 days since symptoms are so significant. Follow up with provider that you have seen previously for eczema on reaction and to seek other treatments.    I recommend dilute bleach baths for people with eczema. These baths help to decrease the number of bacteria on the skin that can cause infections or worsen symptoms. To prepare a bleach bath, one-fourth to one-half cup of bleach is placed in a full bathtub (about 40 gallons) of water. Bleach baths are usually taken for 5 to 10 minutes twice per week and should be followed by application of an emollient (listed above). I recommend you take Benadryl 25mg  - 50mg  every 4 hours to control the symptoms (including itching) but if they last over 24 hours it is best that you see an office based provider for follow up.  HOME CARE: Take  lukewarm showers or baths Apply creams and ointments to prevent the skin from drying (Eucerin, Cetaphil, Nutraderm, petroleum jelly, Aquaphor or Vaseline) - these products contain less water than other lotions and are more effective for moisturizing the skin Limit exposure to cold or dry environments, sweating, emotional stress and anxiety, rapid  temperature changes and exposure to chemicals and cleaning products, soaps and detergents, perfumes, cosmetics, wool and synthetic fibers, dust, sand and cigarette- factors which can aggravate eczema symptoms.  Use a hydrocortisone cream once or twice a day Take an antihistamine like Benadryl for widespread rashes that itch.  The adult dosage of Benadryl is 25-50 mg by mouth 4 times daily. Caution: This type of medication may cause sleepiness.  Do not drink alcohol, drive, or operate dangerous machinery while taking antihistamines.  Do not take these medications if you have prostate enlargement.  Read the package instructions thoroughly on all medications that you take.  GET HELP RIGHT AWAY IF: Symptoms that don't go away after treatment. Severe itching that persists. You develop a fever. Your skin begins to drain. You have a sore throat. You become short of breath.  MAKE SURE YOU   Understand these instructions. Will watch your condition. Will get help right away if you are not doing well or get worse.    Thank you for choosing an e-visit.  Your e-visit answers were reviewed by a board certified advanced clinical practitioner to complete your personal care plan. Depending upon the condition, your plan could have included both over the counter or prescription medications.  Please review your pharmacy choice. Make sure the pharmacy is open so you can pick up prescription now. If there is a problem, you may contact your provider through [employer] and have the prescription routed to another pharmacy.  Your safety is important to [place]. If you have drug allergies check your prescription carefully.   For the next 24 hours you can use MyChart to ask questions about today's visit, request a non-urgent call back, or ask for a work or school excuse. You will get an email in the next two days asking about your experience. I hope that your e-visit has been valuable and will speed your  recovery.  I have spent 5 minutes in review of e-visit questionnaire, review and updating patient chart, medical decision making and response to patient.   Margaretann Loveless, PA-C

## 2023-05-16 ENCOUNTER — Other Ambulatory Visit: Payer: Self-pay

## 2023-05-16 DIAGNOSIS — K130 Diseases of lips: Secondary | ICD-10-CM | POA: Diagnosis not present

## 2023-05-16 DIAGNOSIS — L303 Infective dermatitis: Secondary | ICD-10-CM | POA: Diagnosis not present

## 2023-05-16 DIAGNOSIS — L2084 Intrinsic (allergic) eczema: Secondary | ICD-10-CM | POA: Diagnosis not present

## 2023-05-16 MED ORDER — DOXYCYCLINE MONOHYDRATE 100 MG PO CAPS
100.0000 mg | ORAL_CAPSULE | Freq: Two times a day (BID) | ORAL | 0 refills | Status: AC
  Filled 2023-05-16: qty 20, 10d supply, fill #0

## 2023-06-07 DIAGNOSIS — L309 Dermatitis, unspecified: Secondary | ICD-10-CM | POA: Diagnosis not present

## 2023-06-07 DIAGNOSIS — L258 Unspecified contact dermatitis due to other agents: Secondary | ICD-10-CM | POA: Diagnosis not present

## 2023-06-08 ENCOUNTER — Other Ambulatory Visit: Payer: Self-pay

## 2023-06-11 ENCOUNTER — Other Ambulatory Visit: Payer: Self-pay

## 2023-06-12 ENCOUNTER — Other Ambulatory Visit: Payer: Self-pay

## 2023-06-17 ENCOUNTER — Other Ambulatory Visit: Payer: Self-pay

## 2023-06-26 ENCOUNTER — Other Ambulatory Visit: Payer: Self-pay

## 2023-06-26 ENCOUNTER — Other Ambulatory Visit (HOSPITAL_COMMUNITY): Payer: Self-pay

## 2023-06-26 MED ORDER — DUPIXENT 300 MG/2ML ~~LOC~~ SOAJ: SUBCUTANEOUS | 11 refills | Status: DC

## 2023-06-26 MED ORDER — DUPIXENT 300 MG/2ML ~~LOC~~ SOAJ: SUBCUTANEOUS | 0 refills | Status: DC

## 2023-06-27 ENCOUNTER — Other Ambulatory Visit (HOSPITAL_COMMUNITY): Payer: Self-pay

## 2023-06-27 ENCOUNTER — Other Ambulatory Visit: Payer: Self-pay

## 2023-06-29 ENCOUNTER — Other Ambulatory Visit (HOSPITAL_COMMUNITY): Payer: Self-pay

## 2023-06-29 ENCOUNTER — Other Ambulatory Visit: Payer: Self-pay

## 2023-07-03 ENCOUNTER — Ambulatory Visit: Payer: Commercial Managed Care - PPO | Attending: Internal Medicine | Admitting: Pharmacist

## 2023-07-03 ENCOUNTER — Other Ambulatory Visit (HOSPITAL_COMMUNITY): Payer: Self-pay

## 2023-07-03 ENCOUNTER — Other Ambulatory Visit: Payer: Self-pay

## 2023-07-03 DIAGNOSIS — Z7189 Other specified counseling: Secondary | ICD-10-CM

## 2023-07-03 MED ORDER — DUPIXENT 300 MG/2ML ~~LOC~~ SOPN
PEN_INJECTOR | SUBCUTANEOUS | 0 refills | Status: AC
Start: 2023-07-03 — End: ?
  Filled 2023-07-03: qty 4, fill #0
  Filled 2023-07-04: qty 4, 15d supply, fill #0

## 2023-07-03 MED ORDER — DUPIXENT 300 MG/2ML ~~LOC~~ SOAJ
SUBCUTANEOUS | 11 refills | Status: DC
  Filled 2023-07-03: qty 4, fill #0
  Filled 2023-07-12: qty 4, 28d supply, fill #0
  Filled 2023-08-02: qty 4, 28d supply, fill #1
  Filled 2023-08-29: qty 4, 28d supply, fill #2
  Filled 2023-10-03: qty 4, 28d supply, fill #3

## 2023-07-03 NOTE — Progress Notes (Signed)
   S: Patient presents for review of their specialty medication therapy.  Patient is about to start taking Dupixent for atopic dermatitis. Patient is managed by Dr. Margy Clarks for this.   Adherence: has not started   Efficacy: has not started   Dosing: 600 mg subcutaneous once followed by 300 mg subcutaneous once every 14 days  Dose adjustments: Renal: no dose adjustments (has not been studied) Hepatic: no dose adjustments (has not been studied)  Drug-drug interactions: none identified   Monitoring: S/sx of infection: has not started  S/sx of hypersensitivity: has not started  S/sx of ocular effects: has not started  S/sx of eosinophilia/vasculitis: has not started   O:     Lab Results  Component Value Date   WBC 16.3 (H) 08/31/2021   HGB 13.0 08/31/2021   HCT 36.0 (L) 08/31/2021   MCV 82.0 08/31/2021   PLT 256 08/31/2021      Chemistry      Component Value Date/Time   NA 136 08/31/2021 0619   K 3.8 08/31/2021 0619   CL 107 08/31/2021 0619   CO2 23 08/31/2021 0619   BUN 15 08/31/2021 0619   CREATININE 1.35 (H) 08/31/2021 0619      Component Value Date/Time   CALCIUM 8.6 (L) 08/31/2021 0619   ALKPHOS 73 08/29/2021 2354   AST 22 08/29/2021 2354   ALT 21 08/29/2021 2354   BILITOT 0.8 08/29/2021 2354       A/P: 1. Medication review: Patient about to be started on Dupixent for atopic dermatitis. Reviewed the medication with the patient, including the following: Dupixent is a monoclonal antibody used for the treatment of asthma or atopic dermatitis. Patient educated on purpose, proper use and potential adverse effects of Dupixent. Possible adverse effects include increased risk of infection, ocular effects, vasculitis/eosinophilia, and hypersensitivity reactions. Administer as a SubQ injection and rotate sites. Allow the medication to reach room temp prior to administration (45 mins for 300 mg syringe or 30 min for 200 mg syringe). Do not shake. Discard any unused  portion. No recommendations for any changes.    Butch Penny, PharmD, Patsy [place], CPP Clinical Pharmacist [facility] [phone]

## 2023-07-04 ENCOUNTER — Other Ambulatory Visit: Payer: Self-pay

## 2023-07-04 ENCOUNTER — Other Ambulatory Visit (HOSPITAL_COMMUNITY): Payer: Self-pay

## 2023-07-05 ENCOUNTER — Other Ambulatory Visit (HOSPITAL_COMMUNITY): Payer: Self-pay

## 2023-07-07 ENCOUNTER — Encounter (HOSPITAL_COMMUNITY): Payer: Self-pay

## 2023-07-10 ENCOUNTER — Encounter (HOSPITAL_COMMUNITY): Payer: Self-pay

## 2023-07-10 ENCOUNTER — Other Ambulatory Visit (HOSPITAL_COMMUNITY): Payer: Self-pay

## 2023-07-10 NOTE — Progress Notes (Signed)
Initial Clinical Outreach Assessment completed in [place].  Will follow up in 6 months.

## 2023-07-12 ENCOUNTER — Other Ambulatory Visit (HOSPITAL_COMMUNITY): Payer: Self-pay

## 2023-07-12 ENCOUNTER — Other Ambulatory Visit: Payer: Self-pay | Admitting: Pharmacy Technician

## 2023-07-12 ENCOUNTER — Other Ambulatory Visit: Payer: Self-pay

## 2023-07-12 NOTE — Progress Notes (Signed)
Specialty Pharmacy Refill Coordination Note  Cory GRUPE is a 22 y.o. male contacted today regarding refills of specialty medication(s) Dupilumab .  Patient requested Delivery  on 07/17/23  to verified address [address] [facility]   Medication will be filled on 07/16/23.

## 2023-07-16 ENCOUNTER — Other Ambulatory Visit: Payer: Self-pay

## 2023-08-02 ENCOUNTER — Other Ambulatory Visit: Payer: Self-pay

## 2023-08-02 NOTE — Progress Notes (Signed)
Specialty Pharmacy Refill Coordination Note  Cory Mckee is a 22 y.o. male contacted today regarding refills of specialty medication(s) Dupilumab   Patient requested Delivery   Delivery date: 08/10/23   Verified address: [address] [place] [address]   Medication will be filled on 08/09/23.

## 2023-08-28 ENCOUNTER — Other Ambulatory Visit: Payer: Self-pay

## 2023-08-29 ENCOUNTER — Other Ambulatory Visit: Payer: Self-pay

## 2023-08-29 NOTE — Progress Notes (Signed)
Specialty Pharmacy Refill Coordination Note  Cory Mckee is a 22 y.o. male contacted today regarding refills of specialty medication(s) Dupilumab   Patient requested Delivery   Delivery date: 09/04/23   Verified address: [name]   [place] [address]   Medication will be filled on 09/03/23.

## 2023-09-27 ENCOUNTER — Other Ambulatory Visit: Payer: Self-pay

## 2023-10-03 ENCOUNTER — Other Ambulatory Visit: Payer: Self-pay

## 2023-10-03 NOTE — Progress Notes (Signed)
Specialty Pharmacy Refill Coordination Note  Cory Mckee is a 22 y.o. male contacted today regarding refills of specialty medication(s) Dupilumab (Dupixent)   Patient requested Delivery   Delivery date: 10/09/23   Verified address: [address]   [place] [address]   Medication will be filled on 12.23.24 for 12.27.24 injection.

## 2023-10-08 ENCOUNTER — Other Ambulatory Visit: Payer: Self-pay

## 2023-10-31 ENCOUNTER — Other Ambulatory Visit: Payer: Self-pay

## 2023-11-02 ENCOUNTER — Other Ambulatory Visit: Payer: Self-pay

## 2023-11-02 NOTE — Progress Notes (Signed)
Specialty Pharmacy Refill Coordination Note  Cory Mckee is a 23 y.o. male contacted today regarding refills of specialty medication(s) Dupilumab (Dupixent)   Patient requested Delivery   Delivery date: 11/07/23   Verified address: [name]   [place] [address]   Medication will be filled on 11/06/23 Pending a refill request.

## 2023-11-09 ENCOUNTER — Other Ambulatory Visit: Payer: Self-pay

## 2023-11-09 NOTE — Progress Notes (Signed)
11/09/23 CMA: Dupixent  Patient called back to check on Dupixent. Resent manual fax to [employer] and advised patient to call MD office.

## 2023-11-13 ENCOUNTER — Other Ambulatory Visit: Payer: Self-pay

## 2023-11-23 ENCOUNTER — Other Ambulatory Visit: Payer: Self-pay

## 2023-11-23 MED ORDER — ALBUTEROL SULFATE HFA 108 (90 BASE) MCG/ACT IN AERS
2.0000 | INHALATION_SPRAY | RESPIRATORY_TRACT | 4 refills | Status: AC | PRN
  Filled 2023-11-23: qty 6.7, 16d supply, fill #0
  Filled 2024-02-13: qty 6.7, 16d supply, fill #1

## 2023-11-23 MED ORDER — MONTELUKAST SODIUM 10 MG PO TABS
10.0000 mg | ORAL_TABLET | Freq: Every day | ORAL | 11 refills | Status: AC
  Filled 2023-11-23: qty 30, 30d supply, fill #0
  Filled 2024-01-08: qty 30, 30d supply, fill #1
  Filled 2024-02-13: qty 90, 90d supply, fill #2

## 2023-11-26 ENCOUNTER — Other Ambulatory Visit (HOSPITAL_COMMUNITY): Payer: Self-pay

## 2023-11-28 ENCOUNTER — Other Ambulatory Visit: Payer: Self-pay

## 2023-12-03 ENCOUNTER — Other Ambulatory Visit: Payer: Self-pay

## 2023-12-05 ENCOUNTER — Other Ambulatory Visit: Payer: Self-pay

## 2023-12-17 ENCOUNTER — Other Ambulatory Visit: Payer: Self-pay

## 2024-01-01 ENCOUNTER — Other Ambulatory Visit: Payer: Self-pay

## 2024-01-02 ENCOUNTER — Other Ambulatory Visit (HOSPITAL_COMMUNITY): Payer: Self-pay

## 2024-01-04 ENCOUNTER — Other Ambulatory Visit: Payer: Self-pay

## 2024-02-11 ENCOUNTER — Other Ambulatory Visit (HOSPITAL_COMMUNITY): Payer: Self-pay

## 2024-02-13 ENCOUNTER — Other Ambulatory Visit (HOSPITAL_COMMUNITY): Payer: Self-pay

## 2024-02-13 ENCOUNTER — Other Ambulatory Visit: Payer: Self-pay

## 2024-02-13 MED ORDER — FLUTICASONE-SALMETEROL 100-50 MCG/ACT IN AEPB
1.0000 | INHALATION_SPRAY | Freq: Two times a day (BID) | RESPIRATORY_TRACT | 12 refills | Status: AC
  Filled 2024-02-13: qty 60, 30d supply, fill #0

## 2024-02-27 ENCOUNTER — Other Ambulatory Visit: Payer: Self-pay

## 2024-02-27 ENCOUNTER — Other Ambulatory Visit: Payer: Self-pay | Admitting: Pharmacy Technician

## 2024-02-27 NOTE — Progress Notes (Signed)
 Disenrolled;  Last filled 09/2023

## 2024-04-28 ENCOUNTER — Other Ambulatory Visit: Payer: Self-pay
